# Patient Record
Sex: Male | Born: 1975 | Race: White | Hispanic: No | Marital: Married
Health system: Southern US, Community
[De-identification: ages and names within clinical notes are randomized; demographics above are authoritative.]

---

## 2000-08-13 ENCOUNTER — Ambulatory Visit (HOSPITAL_COMMUNITY): Admission: RE | Admit: 2000-08-13 | Discharge: 2000-08-13 | Payer: Self-pay | Admitting: General Surgery

## 2014-05-01 ENCOUNTER — Other Ambulatory Visit: Payer: Self-pay | Admitting: Family Medicine

## 2014-05-01 ENCOUNTER — Ambulatory Visit
Admission: RE | Admit: 2014-05-01 | Discharge: 2014-05-01 | Disposition: A | Discharge status: Home / Self Care | Payer: Managed Care, Other (non HMO) | Source: Ambulatory Visit | Attending: Family Medicine | Admitting: Family Medicine

## 2014-05-01 DIAGNOSIS — M545 Low back pain, unspecified: Secondary | ICD-10-CM

## 2015-10-12 IMAGING — CR DG LUMBAR SPINE 2-3V
3 series · 3 of 3 positions shown · non-contrast
Comparison: None.

CLINICAL DATA: LOW BACK PAIN

EXAM:
LUMBAR SPINE - 2-3 VIEW

[view not recorded (1 of 3)]
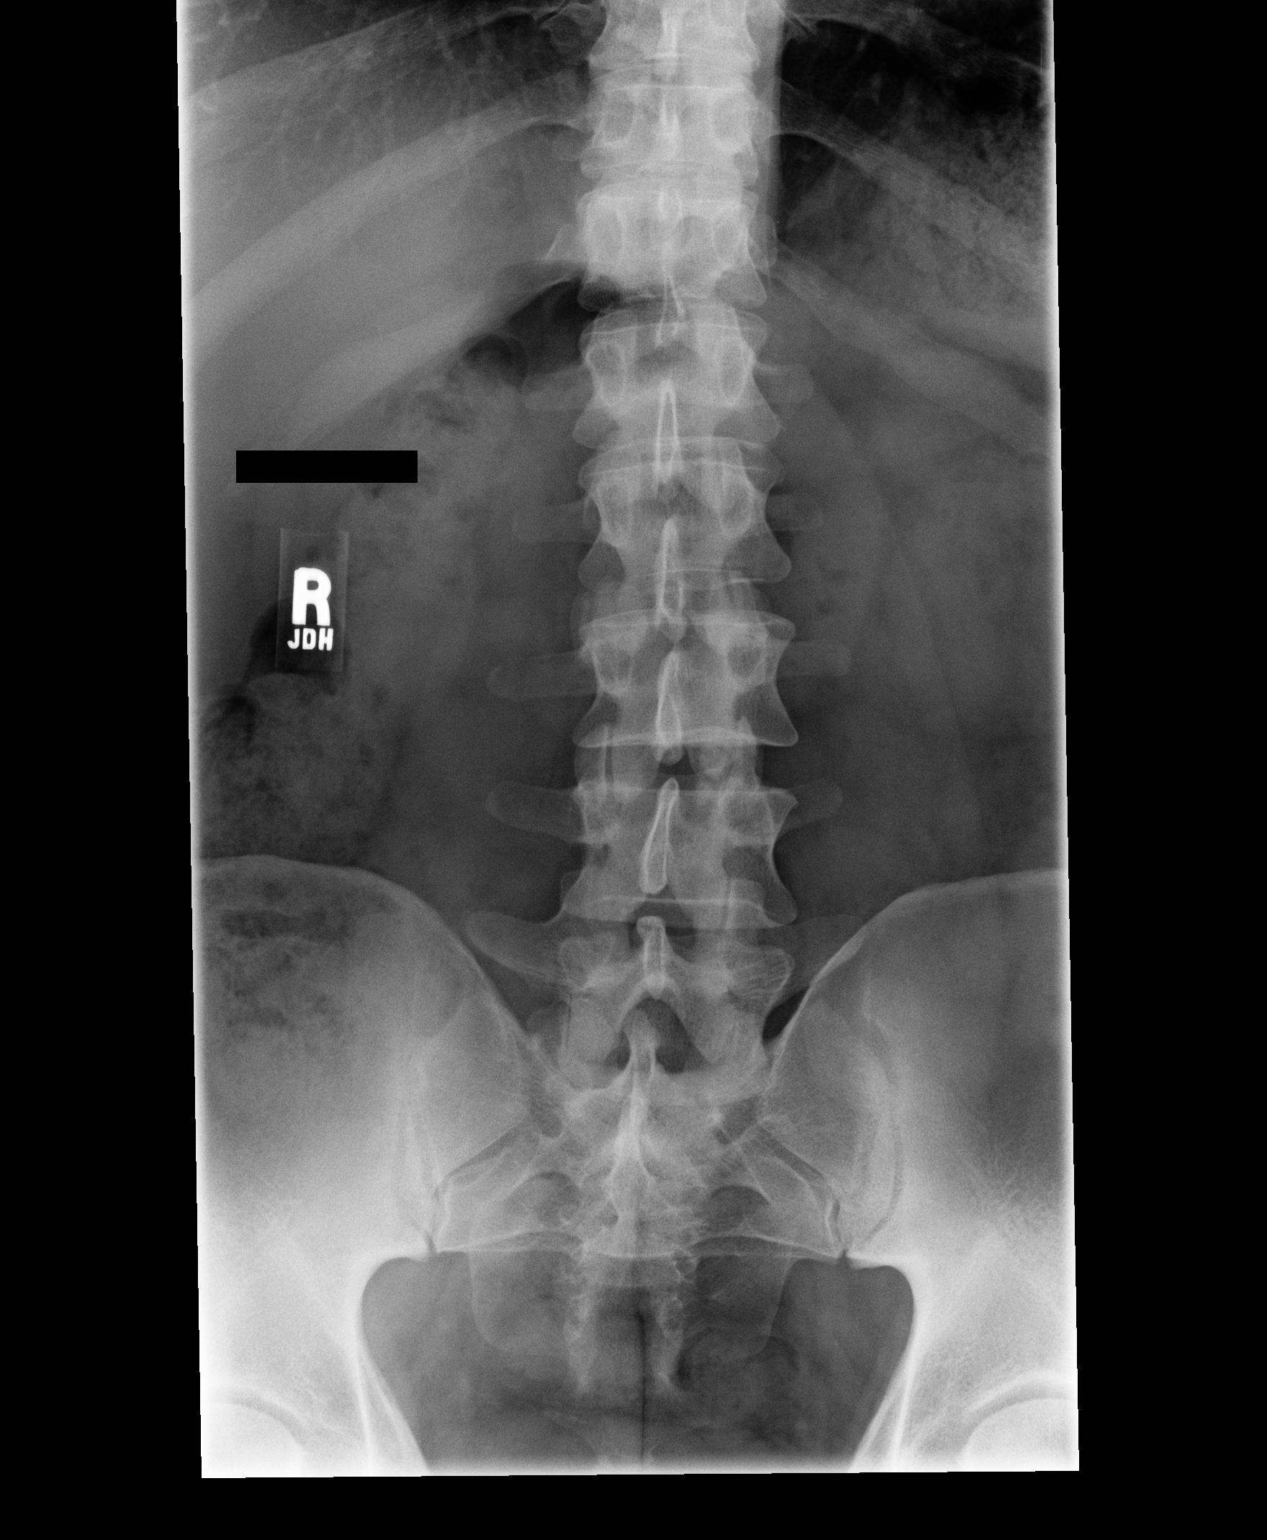

[view not recorded (2 of 3)]
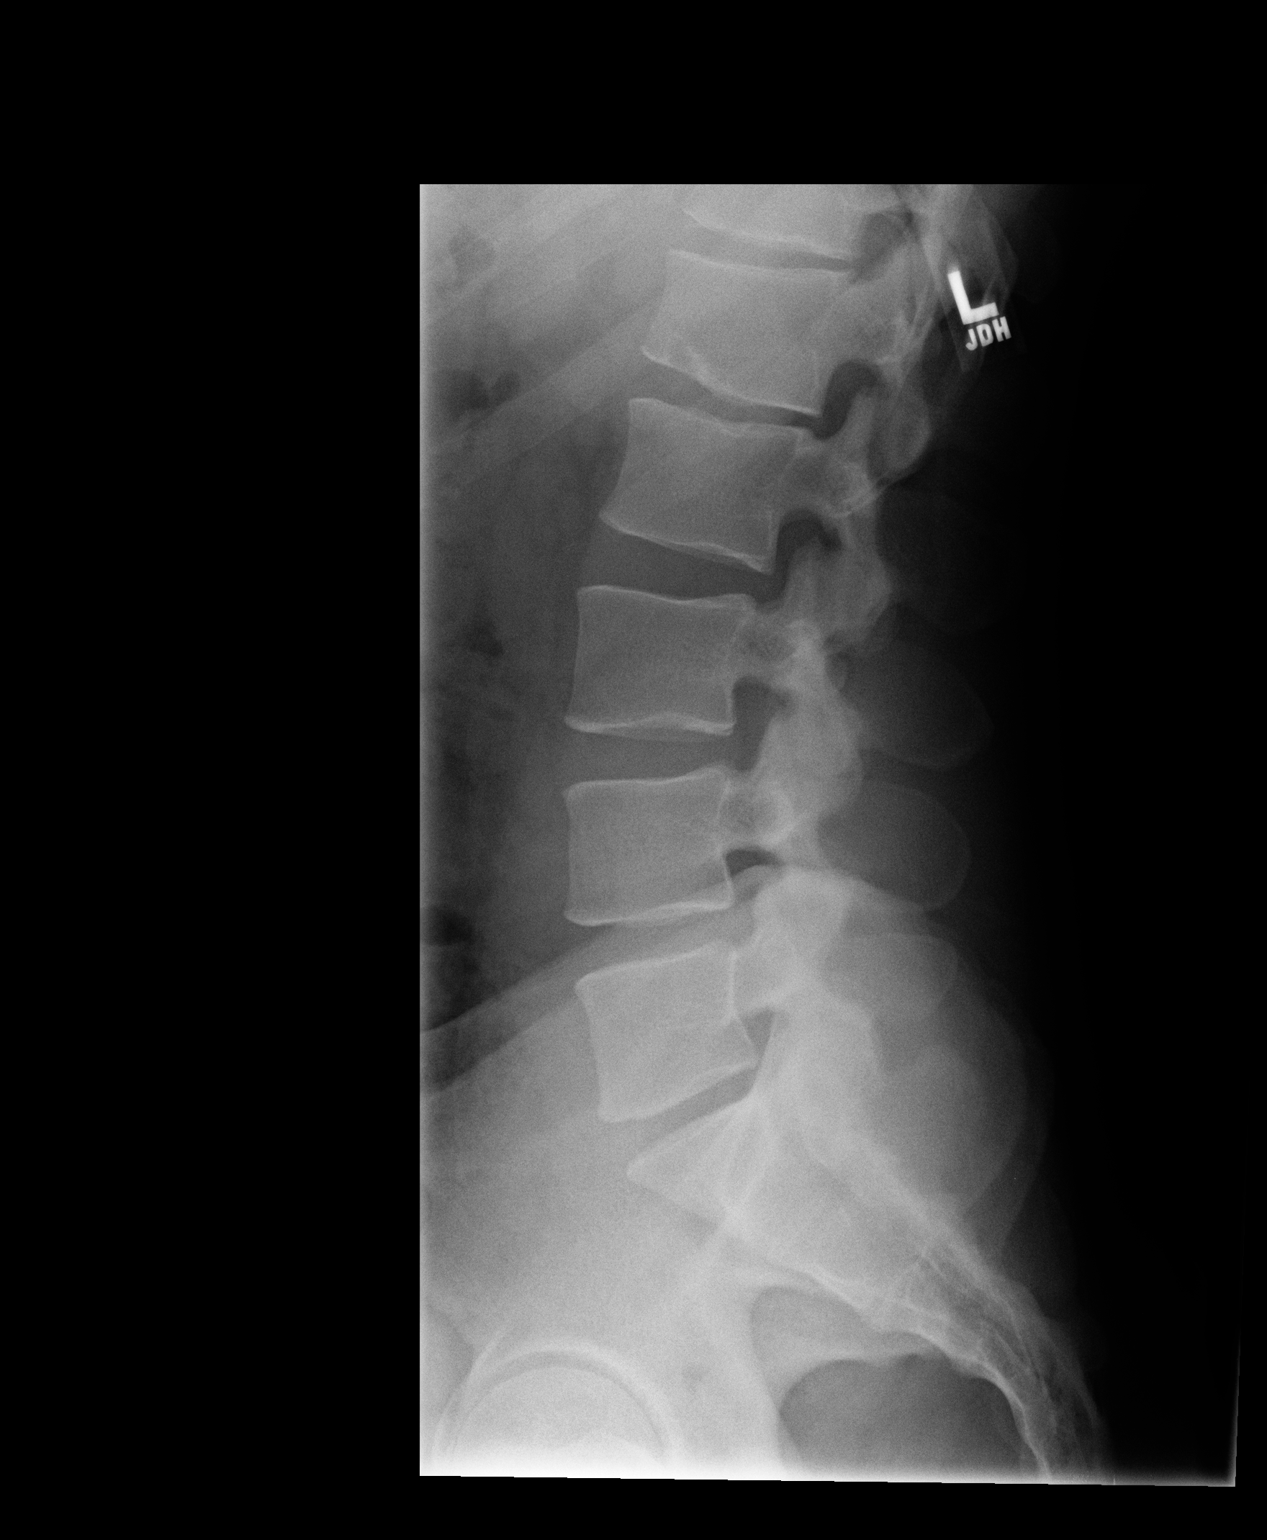

[view not recorded (3 of 3)]
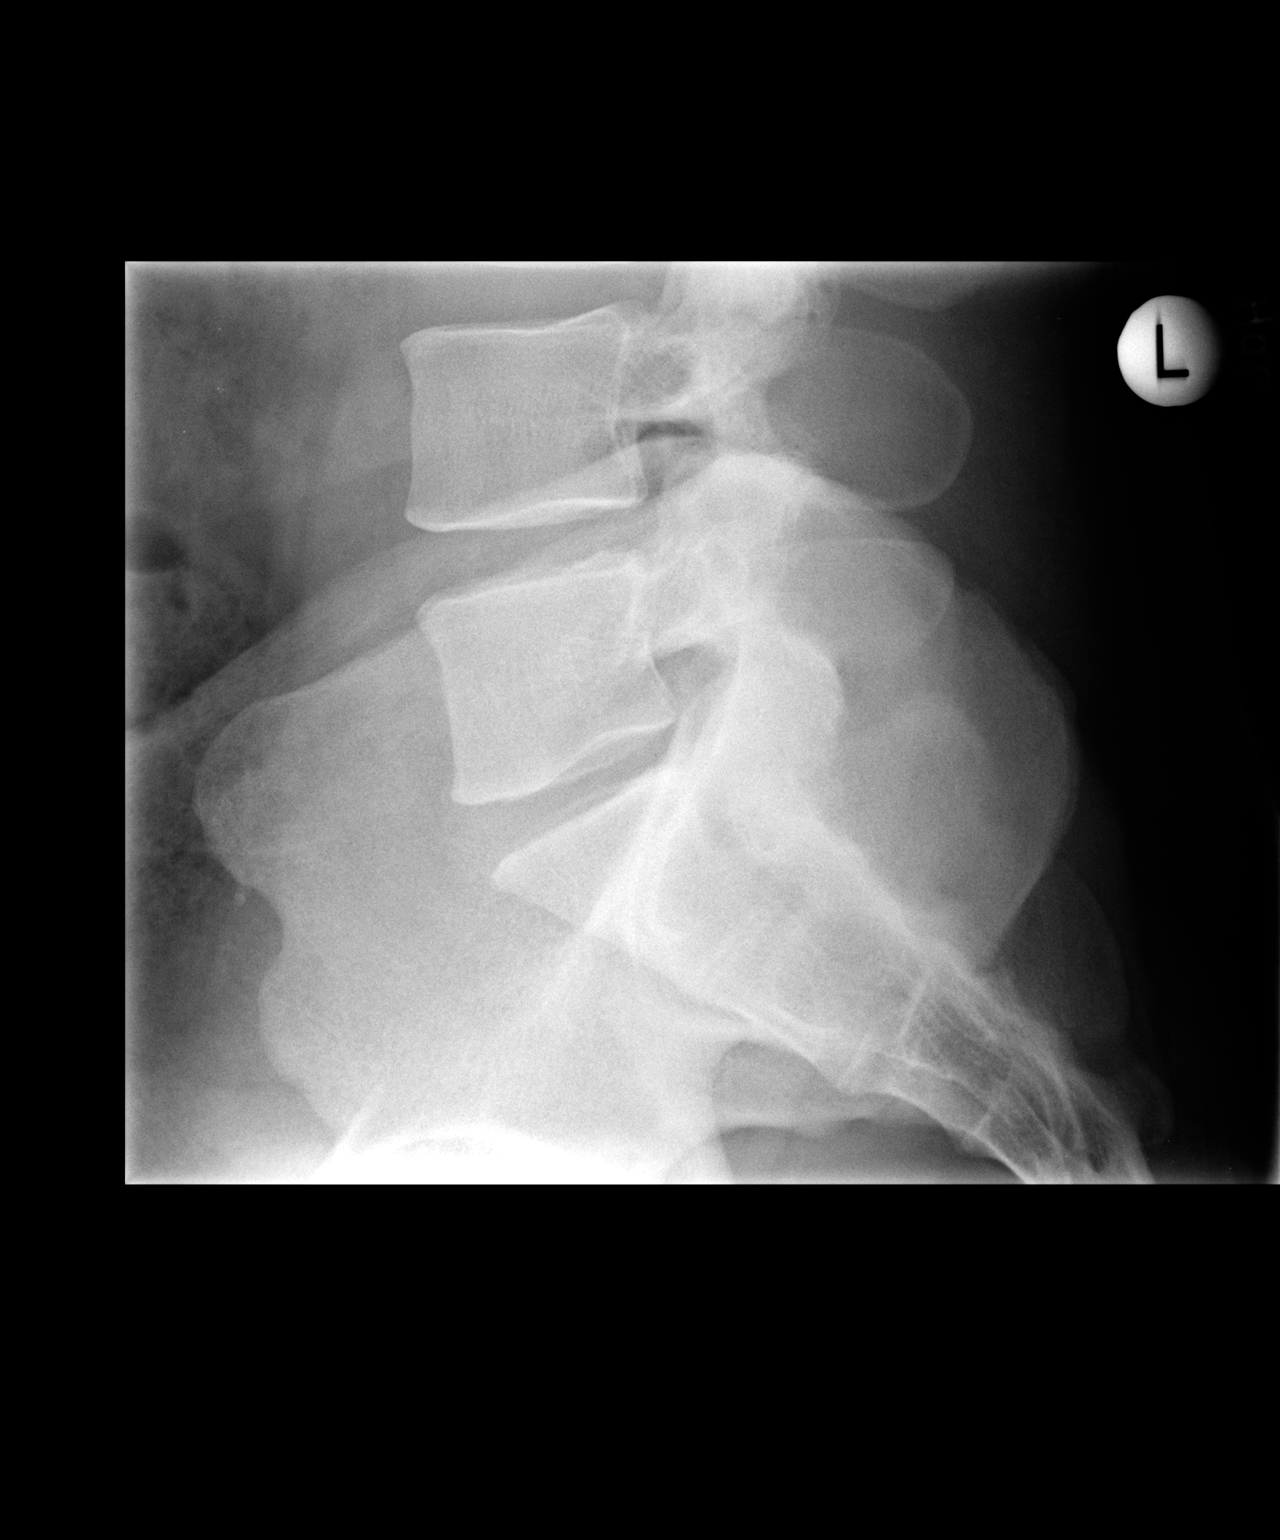

[3 of 3 positions shown; findings below may reference images not displayed]

FINDINGS: There is no evidence of lumbar spine fracture. Alignment is normal.
Mild narrowing of the L1-2 interspace. Small Schmorl's node in the
inferior endplate of L1.
IMPRESSION: 1. Negative for fracture or other acute bone abnormality.
2. Mild degenerative disc disease L1-2.

## 2024-08-08 ENCOUNTER — Ambulatory Visit: Payer: Self-pay | Admitting: General Surgery
